# Patient Record
Sex: Female | Born: 1950 | Race: White | Hispanic: No | Marital: Married
Health system: Southern US, Community
[De-identification: ages and names within clinical notes are randomized; demographics above are authoritative.]

## PROBLEM LIST (undated history)

## (undated) DIAGNOSIS — R6520 Severe sepsis without septic shock: Secondary | ICD-10-CM

## (undated) DIAGNOSIS — I2699 Other pulmonary embolism without acute cor pulmonale: Secondary | ICD-10-CM

## (undated) DIAGNOSIS — I471 Supraventricular tachycardia, unspecified: Secondary | ICD-10-CM

## (undated) DIAGNOSIS — J9621 Acute and chronic respiratory failure with hypoxia: Secondary | ICD-10-CM

## (undated) DIAGNOSIS — A419 Sepsis, unspecified organism: Secondary | ICD-10-CM

## (undated) DIAGNOSIS — R198 Other specified symptoms and signs involving the digestive system and abdomen: Secondary | ICD-10-CM

---

## 2018-12-26 ENCOUNTER — Inpatient Hospital Stay
Admission: EM | Admit: 2018-12-26 | Discharge: 2019-01-17 | Disposition: E | Payer: Medicare Other | Source: Other Acute Inpatient Hospital | Attending: Internal Medicine | Admitting: Internal Medicine

## 2018-12-26 ENCOUNTER — Other Ambulatory Visit (HOSPITAL_COMMUNITY): Payer: Medicare Other

## 2018-12-26 DIAGNOSIS — I471 Supraventricular tachycardia, unspecified: Secondary | ICD-10-CM | POA: Diagnosis present

## 2018-12-26 DIAGNOSIS — K567 Ileus, unspecified: Secondary | ICD-10-CM

## 2018-12-26 DIAGNOSIS — R6520 Severe sepsis without septic shock: Secondary | ICD-10-CM | POA: Diagnosis present

## 2018-12-26 DIAGNOSIS — I2699 Other pulmonary embolism without acute cor pulmonale: Secondary | ICD-10-CM | POA: Diagnosis present

## 2018-12-26 DIAGNOSIS — R198 Other specified symptoms and signs involving the digestive system and abdomen: Secondary | ICD-10-CM | POA: Diagnosis present

## 2018-12-26 DIAGNOSIS — J9621 Acute and chronic respiratory failure with hypoxia: Secondary | ICD-10-CM | POA: Diagnosis present

## 2018-12-26 DIAGNOSIS — A419 Sepsis, unspecified organism: Secondary | ICD-10-CM | POA: Diagnosis present

## 2018-12-26 DIAGNOSIS — J9690 Respiratory failure, unspecified, unspecified whether with hypoxia or hypercapnia: Secondary | ICD-10-CM

## 2018-12-26 DIAGNOSIS — L0291 Cutaneous abscess, unspecified: Secondary | ICD-10-CM

## 2018-12-26 HISTORY — DX: Severe sepsis without septic shock: R65.20

## 2018-12-26 HISTORY — DX: Supraventricular tachycardia: I47.1

## 2018-12-26 HISTORY — DX: Sepsis, unspecified organism: A41.9

## 2018-12-26 HISTORY — DX: Other pulmonary embolism without acute cor pulmonale: I26.99

## 2018-12-26 HISTORY — DX: Acute and chronic respiratory failure with hypoxia: J96.21

## 2018-12-26 HISTORY — DX: Other specified symptoms and signs involving the digestive system and abdomen: R19.8

## 2018-12-26 HISTORY — DX: Supraventricular tachycardia, unspecified: I47.10

## 2018-12-27 ENCOUNTER — Other Ambulatory Visit (HOSPITAL_COMMUNITY): Payer: Medicare Other

## 2018-12-27 DIAGNOSIS — A419 Sepsis, unspecified organism: Secondary | ICD-10-CM

## 2018-12-27 DIAGNOSIS — I2782 Chronic pulmonary embolism: Secondary | ICD-10-CM | POA: Diagnosis not present

## 2018-12-27 DIAGNOSIS — I471 Supraventricular tachycardia: Secondary | ICD-10-CM

## 2018-12-27 DIAGNOSIS — J9621 Acute and chronic respiratory failure with hypoxia: Secondary | ICD-10-CM

## 2018-12-27 DIAGNOSIS — R198 Other specified symptoms and signs involving the digestive system and abdomen: Secondary | ICD-10-CM | POA: Diagnosis not present

## 2018-12-27 DIAGNOSIS — R6520 Severe sepsis without septic shock: Secondary | ICD-10-CM

## 2018-12-27 LAB — URINALYSIS, ROUTINE W REFLEX MICROSCOPIC
Bilirubin Urine: NEGATIVE
Glucose, UA: NEGATIVE mg/dL
Ketones, ur: NEGATIVE mg/dL
Leukocytes,Ua: NEGATIVE
Nitrite: NEGATIVE
Protein, ur: NEGATIVE mg/dL
Specific Gravity, Urine: 1.006 (ref 1.005–1.030)
pH: 7 (ref 5.0–8.0)

## 2018-12-27 LAB — CBC WITH DIFFERENTIAL/PLATELET
Abs Immature Granulocytes: 0.2 10*3/uL — ABNORMAL HIGH (ref 0.00–0.07)
Basophils Absolute: 0 10*3/uL (ref 0.0–0.1)
Basophils Relative: 0 %
Eosinophils Absolute: 0.1 10*3/uL (ref 0.0–0.5)
Eosinophils Relative: 1 %
HCT: 28.2 % — ABNORMAL LOW (ref 36.0–46.0)
Hemoglobin: 8.8 g/dL — ABNORMAL LOW (ref 12.0–15.0)
Lymphocytes Relative: 11 %
Lymphs Abs: 1.1 10*3/uL (ref 0.7–4.0)
MCH: 29.7 pg (ref 26.0–34.0)
MCHC: 31.2 g/dL (ref 30.0–36.0)
MCV: 95.3 fL (ref 80.0–100.0)
Metamyelocytes Relative: 2 %
Monocytes Absolute: 0.3 10*3/uL (ref 0.1–1.0)
Monocytes Relative: 3 %
Neutro Abs: 8 10*3/uL — ABNORMAL HIGH (ref 1.7–7.7)
Neutrophils Relative %: 83 %
Platelets: 322 10*3/uL (ref 150–400)
RBC: 2.96 MIL/uL — ABNORMAL LOW (ref 3.87–5.11)
RDW: 18.7 % — ABNORMAL HIGH (ref 11.5–15.5)
WBC: 9.6 10*3/uL (ref 4.0–10.5)
nRBC: 0 /100 WBC
nRBC: 0.6 % — ABNORMAL HIGH (ref 0.0–0.2)

## 2018-12-27 LAB — MAGNESIUM: Magnesium: 2.4 mg/dL (ref 1.7–2.4)

## 2018-12-27 LAB — PROTIME-INR
INR: 1.1 (ref 0.8–1.2)
Prothrombin Time: 14.1 seconds (ref 11.4–15.2)

## 2018-12-27 LAB — COMPREHENSIVE METABOLIC PANEL
ALT: 25 U/L (ref 0–44)
AST: 29 U/L (ref 15–41)
Albumin: 2.2 g/dL — ABNORMAL LOW (ref 3.5–5.0)
Alkaline Phosphatase: 211 U/L — ABNORMAL HIGH (ref 38–126)
Anion gap: 14 (ref 5–15)
BUN: 76 mg/dL — ABNORMAL HIGH (ref 8–23)
CO2: 24 mmol/L (ref 22–32)
Calcium: 9.5 mg/dL (ref 8.9–10.3)
Chloride: 107 mmol/L (ref 98–111)
Creatinine, Ser: 1.18 mg/dL — ABNORMAL HIGH (ref 0.44–1.00)
GFR calc Af Amer: 55 mL/min — ABNORMAL LOW (ref >60)
GFR calc non Af Amer: 48 mL/min — ABNORMAL LOW (ref >60)
Glucose, Bld: 116 mg/dL — ABNORMAL HIGH (ref 70–99)
Potassium: 3.3 mmol/L — ABNORMAL LOW (ref 3.5–5.1)
Sodium: 145 mmol/L (ref 135–145)
Total Bilirubin: 1.2 mg/dL (ref 0.3–1.2)
Total Protein: 7.6 g/dL (ref 6.5–8.1)

## 2018-12-27 LAB — PHOSPHORUS: Phosphorus: 4.4 mg/dL (ref 2.5–4.6)

## 2018-12-27 LAB — HEMOGLOBIN A1C
Hgb A1c MFr Bld: 5.7 % — ABNORMAL HIGH (ref 4.8–5.6)
Mean Plasma Glucose: 116.89 mg/dL

## 2018-12-27 LAB — TSH: TSH: 6.46 u[IU]/mL — ABNORMAL HIGH (ref 0.350–4.500)

## 2018-12-27 MED ORDER — IOHEXOL 300 MG/ML  SOLN
100.0000 mL | Freq: Once | INTRAMUSCULAR | Status: AC | PRN
  Administered 2018-12-27: 100 mL via INTRAVENOUS

## 2018-12-27 MED ORDER — Medication
4000.00 | Status: DC
Start: ? — End: 2018-12-27

## 2018-12-27 MED ORDER — Medication
Status: DC
Start: ? — End: 2018-12-27

## 2018-12-27 MED ORDER — GENERIC EXTERNAL MEDICATION
Status: DC
Start: ? — End: 2018-12-27

## 2018-12-27 MED ORDER — BENICAR 20 MG PO TABS
25.00 | ORAL_TABLET | ORAL | Status: DC
Start: ? — End: 2018-12-27

## 2018-12-27 MED ORDER — ATROPINE SULFATE 1 MG/10ML IJ SOSY [COMPILED RECORD] [AGE PEDIATRIC]
1.00 | PREFILLED_SYRINGE | INTRAMUSCULAR | Status: DC
Start: ? — End: 2018-12-27

## 2018-12-27 MED ORDER — PHENYLEPHRINE-GUAIFENESIN 20-375 MG PO CP12
10.00 | ORAL_CAPSULE | ORAL | Status: DC
Start: ? — End: 2018-12-27

## 2018-12-27 MED ORDER — Medication
1.00 | Status: DC
Start: ? — End: 2018-12-27

## 2018-12-27 MED ORDER — BL TUSSIN CF 30-10-100 MG/5ML PO SYRP
2.00 | ORAL_SOLUTION | ORAL | Status: DC
Start: ? — End: 2018-12-27

## 2018-12-27 MED ORDER — COPPER (TRACE MINERALS)
0.00 | Status: DC
Start: 2018-12-26 — End: 2018-12-27

## 2018-12-27 MED ORDER — SURE RESULT SR RELIEF EX
1.00 | CUTANEOUS | Status: DC
Start: ? — End: 2018-12-27

## 2018-12-27 MED ORDER — EQUATE NICOTINE 4 MG MT GUM
4.00 | CHEWING_GUM | OROMUCOSAL | Status: DC
Start: ? — End: 2018-12-27

## 2018-12-27 MED ORDER — Medication
4.00 | Status: DC
Start: ? — End: 2018-12-27

## 2018-12-27 MED ORDER — MYLANTA ULTRA 700-300 MG PO CHEW
3.00 | CHEWABLE_TABLET | ORAL | Status: DC
Start: 2018-12-26 — End: 2018-12-27

## 2018-12-27 MED ORDER — CELLULOSE SODIUM PHOSPHATE VI
5000.00 | Status: DC
Start: 2018-12-26 — End: 2018-12-27

## 2018-12-27 MED ORDER — PILOCARPINE-EPINEPHRINE 1-1 % OP SOLN
OPHTHALMIC | Status: DC
Start: 2018-12-27 — End: 2018-12-27

## 2018-12-27 MED ORDER — SODIUM CHLORIDE 0.9 % IV SOLN
INTRAVENOUS | Status: DC
Start: ? — End: 2018-12-27

## 2018-12-27 MED ORDER — ASTELIN 137 MCG/SPRAY NA SOLN
40.00 | NASAL | Status: DC
Start: 2018-12-26 — End: 2018-12-27

## 2018-12-27 MED ORDER — LOVENOX 150 MG/ML ~~LOC~~ SOLN
2.00 | SUBCUTANEOUS | Status: DC
Start: ? — End: 2018-12-27

## 2018-12-27 MED ORDER — SUPER B-50 B COMPLEX PO
0.04 | ORAL | Status: DC
Start: ? — End: 2018-12-27

## 2018-12-27 MED ORDER — MILRINONE LACTATE 20 MG/20ML IV SOLN
0.50 | INTRAVENOUS | Status: DC
Start: ? — End: 2018-12-27

## 2018-12-27 MED ORDER — GLUCOSE BLOOD VI
20.00 | Status: DC
Start: ? — End: 2018-12-27

## 2018-12-27 MED ORDER — PHENOL EX
40.00 | CUTANEOUS | Status: DC
Start: 2018-12-27 — End: 2018-12-27

## 2018-12-27 MED ORDER — JENTADUETO 2.5-1000 MG PO TABS
12.00 | ORAL_TABLET | ORAL | Status: DC
Start: ? — End: 2018-12-27

## 2018-12-27 MED ORDER — ETRAFON 2-10 2-10 MG OR TABS
650.00 | ORAL_TABLET | ORAL | Status: DC
Start: ? — End: 2018-12-27

## 2018-12-27 MED ORDER — STRI-DEX MAXIMUM STRENGTH 2 % EX PADS
MEDICATED_PAD | CUTANEOUS | Status: DC
Start: ? — End: 2018-12-27

## 2018-12-27 MED ORDER — TL-HIST PD PO
0.02 | ORAL | Status: DC
Start: ? — End: 2018-12-27

## 2018-12-27 MED ORDER — Medication
40.00 | Status: DC
Start: ? — End: 2018-12-27

## 2018-12-27 NOTE — Consult Note (Signed)
Infectious Disease Consultation   Holly Wallace  WUJ:[id number]  DOB: 1950-08-03  DOA: 01/10/2019  Requesting physician: Dr.Hijazi  Reason for consultation: Antibiotic recommendations   History of Present Illness: Patient with trach in place, confused.  Unable to provide any history at this time.  Therefore history obtained from medical records. Holly Wallace is an 68 y.o. female with history of atrial fibrillation, coronary disease, COPD, GERD, hypertension, seizure disorder, recent pulmonary embolism on anticoagulation who [facility] with abdominal pain that started on November 02, 2018.  She also had epigastric pain and nausea.  Symptoms got progressively worse.  Patient reportedly mentioned in the ER that she was having on and off pain for couple of years but did not seek medical attention.  Lab work showed elevated WBC of 15.2.  Abdominal x-ray showed large hiatal hernia, nonobstructive bowel gas pattern.  CT of the abdomen and pelvis showed large hiatal hernia and extraluminal air with infiltrative changes within the hernia indicating perforated viscus.  Surgery was consulted.  Patient was taken emergently to the OR for the first of many abdominal procedures.  She has a history of gastric bypass.   Following is the list of her Surgeries per records from outside hosp: admitted on 11/03/18 with Pneumoperitoneum [K66.8]. Ms. Holly Wallace was admitted with complaints of abdominal pain and work-up was revealing for pneumoperitoneum secondary to perforated viscus. She was taken to the OR for emergent laparotomy with washout and repair. She went back to the OR on 11/19/2018 for reexploration, right colectomy, extensive lysis of adhesions, ileostomy creation, and placement of a negative pressure dressing. Was taken to the OR on 11/22/2018 for reexploration, washout, gastrostomy tube placement, complex abdominal wall closure and negative pressure dressing.  Tracheostomy was placed at the bedside on 11/26/2018. Taken to the OR on 11/27/2018, 11/28/2018, 11/29/2018 for abdominal washout and tightening of the patch. Last surgery was on 12/02/2018 for fascial closure and EGD.   Subsequent procedure on 12/02/2018 where she had wound closure?  Placement of negative pressure dressing.  She had bedside trach on 12/06/2018.  Per records from the outside facility which are quite patchy apparently trickle feed trial was initiated on 12/05/2018 resulted in entry drainage from JP. 12/20/2018 status post endoscopy by GI service with placement of bear claw clip at the area of the anastomotic dehiscence within the gastric remnant. Patient's stay was complicated by septic shock status post pressors and fluid boluses.  Hospital course complicated by respiratory failure. Not sure what antibiotics she received at the outside facility.  Looks like she got treated with Levaquin.  Wound cultures at the outside facility showed coag negative staph, E. coli which was resistant to Levaquin.  She is currently on TPN.  She is very confused at this time.   Review of Systems:  Unable to obtain review of systems.  Past Medical History: . Anxiety  . Arthritis  . Atrial fibrillation (HCC)  . CAD (coronary artery disease)  pt states h/o mild MI  . Calculus of kidney  10 years ago  . COPD (chronic obstructive pulmonary disease) (HCC)  . ETOH abuse  Pt States she hasn't drank any alcohol since December 2018  . GERD (gastroesophageal reflux disease)  . Headache  pt states chronic headaches  . Hypertension  . Obstructive sleep apnea  doesn't have cpap  . Osteoporosis  . Seizure disorder (HCC)  pt states seizure x 1  . Thromboembolism (HCC)  12/29/2016  Femoral Filter placed   Past Surgical History: . HX ESOPHAGOSCOPY W/ DILATION  Approximately 2 years ago  . HX GASTRIC BYPASS  . HX GASTRIC BYPASS  Bypass was 25-30 years ago  . HX HEART CATHETERIZATION  . HX TEMPORAL  ARTERY BIOPSY / LIGATION 10/29/2018  . HX TUBAL LIGATION  . IR VENA CAVA FILTER PLACEMENT 12/29/2016  IR VENA CAVA FILTER PLACEMENT 12/29/2016 Holly Gearing, MD [facility]  . IR VENA CAVA FILTER RETRIEVAL 06/12/2017  IR VENA CAVA FILTER RETRIEVAL 06/12/2017 Holly Gearing, MD [facility]  . PR COLONOSCOPY FLX DX W/COLLJ SPEC WHEN PFRMD N/A 04/02/2017  COLONOSCOPY FLX DX W/COLLJ SPEC WHEN PFRMD performed by Holly Garfinkel, MD at [facility] ENDO  . PR ESOPHAGOGASTRODUODENOSCOPY TRANSORAL DIAGNOSTIC N/A 01/28/2014  ESOPHAGOGASTRODUODENOSCOPY performed by Holly Garfinkel, MD at [facility] ENDO  . PR ESOPHAGOGASTRODUODENOSCOPY TRANSORAL DIAGNOSTIC N/A 04-01-202018  ESOPHAGOGASTRODUODENOSCOPY TRANSORAL DIAGNOSTIC performed by Holly Garfinkel, MD at [facility] ENDO  . PT DENIES RELEVANT SURGICAL HISTORY    Allergies:  Latex Hives  . Adhesive Tape-Silicones Rash and Swelling  . Diphenhydramine Hcl Rash  . Lipitor [Atorvastatin] Unknown  . Paroxetine Hcl Hives  . Strawberry Hives  . Sumatriptan Headache  . Imitrex [Sumatriptan Succinate] Nausea and Vomiting  . Rocephin [Ceftriaxone] Rash    Social History: Obtained from medical records reports that she quit smoking about 10 years ago. Her smoking use included cigarettes. She started smoking about 53 years ago. She smoked 0.50 packs per day. She has never used smokeless tobacco. She reports current alcohol use of about 5.0 standard drinks of alcohol per week. She reports that she does not use drugs.   Family History: FamHx: family history includes Cancer-Breast in her sister; Cancer-Colon in her brother; Heart Disease in her father and mother; Hypertension in her father and mother; Other in her father.     Physical Exam: Vitals: Temperature 100.7, pulse 112, respiratory 37, blood pressure 133/64, pulse oximetry 95% Constitutional:  Ill-appearing, obese female, confused Head: Normocephalic, atraumatic Eyes: PERLA, no scleral icterus ENMT: external ears and nose appear normal, normal hearing, Lips appears normal  Neck: Has trach in place CVS: S1-S2, no murmur Respiratory: Occasional rhonchi, no wheezing Abdomen: Obese female, she has dressing in place, abdominal drain with purulent drainage from around the drain, ostomy, tenderness without any guarding or rebound Musculoskeletal: Mild lower extremity edema Neuro: She is confused, not following commands.  Unable to do a neurologic exam at this time. Psych: Unable to assess  skin: no rashes  Data reviewed:  I have personally reviewed following labs and imaging studies Labs:  CBC: Recent Labs  Lab 12/27/18 0518  WBC 9.6  NEUTROABS 8.0*  HGB 8.8*  HCT 28.2*  MCV 95.3  PLT 322    Basic Metabolic Panel: Recent Labs  Lab 12/27/18 0518  NA 145  K 3.3*  CL 107  CO2 24  GLUCOSE 116*  BUN 76*  CREATININE 1.18*  CALCIUM 9.5  MG 2.4  PHOS 4.4   GFR CrCl cannot be calculated (Unknown ideal weight.). Liver Function Tests: Recent Labs  Lab 12/27/18 0518  AST 29  ALT 25  ALKPHOS 211*  BILITOT 1.2  PROT 7.6  ALBUMIN 2.2*   No results for input(s): LIPASE, AMYLASE in the last 168 hours. No results for input(s): AMMONIA in the last 168 hours. Coagulation profile Recent Labs  Lab 12/27/18 0518  INR 1.1    Cardiac Enzymes: No  results for input(s): CKTOTAL, CKMB, CKMBINDEX, TROPONINI in the last 168 hours. BNP: Invalid input(s): POCBNP CBG: No results for input(s): GLUCAP in the last 168 hours. D-Dimer No results for input(s): DDIMER in the last 72 hours. Hgb A1c Recent Labs    12/27/18 0518  HGBA1C 5.7*   Lipid Profile No results for input(s): CHOL, HDL, LDLCALC, TRIG, CHOLHDL, LDLDIRECT in the last 72 hours. Thyroid function studies Recent Labs    12/27/18 0518  TSH 6.460*   Anemia work up No results for input(s): VITAMINB12,  FOLATE, FERRITIN, TIBC, IRON, RETICCTPCT in the last 72 hours. Urinalysis    Component Value Date/Time   COLORURINE STRAW (A) 12/27/2018 1223   APPEARANCEUR CLEAR 12/27/2018 1223   LABSPEC 1.006 12/27/2018 1223   PHURINE 7.0 12/27/2018 1223   GLUCOSEU NEGATIVE 12/27/2018 1223   HGBUR SMALL (A) 12/27/2018 1223   BILIRUBINUR NEGATIVE 12/27/2018 1223   KETONESUR NEGATIVE 12/27/2018 1223   PROTEINUR NEGATIVE 12/27/2018 1223   NITRITE NEGATIVE 12/27/2018 1223   LEUKOCYTESUR NEGATIVE 12/27/2018 1223     Microbiology Recent Results (from the past 240 hour(s))  Culture, respiratory (non-expectorated)     Status: None (Preliminary result)   Collection Time: 12/27/18 10:20 AM   Specimen: Tracheal Aspirate; Respiratory  Result Value Ref Range Status   Specimen Description TRACHEAL ASPIRATE PER RN KRISTY  Final   Special Requests   Final    NONE Performed at [facility], [place] [address], [place], [place] [address]    Gram Stain PENDING  Incomplete   Culture PENDING  Incomplete   Report Status PENDING  Incomplete       Inpatient Medications:   Scheduled Meds: Please see MAR   Radiological Exams on Admission: DG CHEST PORT 1 VIEW  Result Date: 01/15/2019 CLINICAL DATA:  Respiratory distress EXAM: PORTABLE CHEST 1 VIEW COMPARISON:  None. FINDINGS: Tracheostomy tube in the mid trachea. Lung volumes are diminished with diffuse hazy opacities likely reflecting some atelectatic change. More focal opacity is noted in the left lung base adjacent a moderate left pleural effusion. No pneumothorax. Cardiomediastinal contours partially obscured by overlying opacity. Visible portions are unremarkable. No acute osseous or soft tissue abnormality. Surgical clips in the left upper quadrant. IMPRESSION: 1. Focal opacity in the left lung base adjacent to a moderate left pleural effusion, favor passive atelectasis though underlying infection cannot be excluded. 2. Low lung volumes with more  diffuse atelectatic changes. 3. Tracheostomy tube in the mid trachea. Electronically Signed   By: Lovena Le M.D.   On: 12/28/2018 20:45   DG Abd Portable 1V  Result Date: 01/11/2019 CLINICAL DATA:  Concern for ileus EXAM: PORTABLE ABDOMEN - 1 VIEW COMPARISON:  None. FINDINGS: A percutaneous enteric tube projects over the left upper quadrant with extensive surgical material seen in the left upper quadrant as well. There is a paucity of abdominal bowel gas which is a nonspecific finding. No convincing features of free intraperitoneal air though evaluation limited on supine only radiography. Dense calcification projects over the right sacrum. Could reflect material within the bowel, fecalith from the colon, amongst other etiologies. Few phleboliths are noted in the pelvis. Degenerative changes present in the spine and bony pelvis. IMPRESSION: 1. Paucity of abdominal bowel gas which is a nonspecific finding that can be both normal or seen with ileus or obstruction. 2. Right upper quadrant percutaneous enteric tube with postsurgical changes in the left upper quadrant. Placement poorly assessed in the absence of contrast. 3. Dense calcification projects  over the right sacrum, nonspecific. Possibly ingested material, fecalith, fibroid among other possibilities. Electronically Signed   By: Kreg Shropshire M.D.   On: 12/25/2018 20:48    Impression/Recommendations Acute respiratory failure Status post septic shock Peritonitis status post bowel perforation Intra-abdominal abscess? Status post multiple abdominal surgeries with ostomy Postoperative abdominal wound Acute on chronic renal failure DVT with recent pulmonary embolism Atrial fibrillation Dysphagia Protein calorie malnutrition Morbid obesity Acute toxic/metabolic encephalopathy  Acute respiratory failure: Patient has morbid obesity likely has obesity hypoventilation syndrome.  She probably also has a history of COPD given the history of smoking.   The acute respiratory failure happened after she underwent surgery for bowel perforation/peritonitis.  She subsequently required multiple surgeries.  Currently on trach.  Chest x-ray per report does not mention pneumonia.  However, she is at high risk for ventilator associated pneumonia, she does have dysphagia, high risk for aspiration and aspiration pneumonia. Pulmonary consulted.  Cultures ordered.  Status post septic shock: Likely secondary to the peritonitis.  Patient reportedly required pressors at the outside facility.  She apparently was treated with Levaquin.  Could not find any cultures.  She is at risk for recurrent sepsis.  She has concern for intra-abdominal abscess with drainage of purulent material from around her abdominal drain.  Intraoperative cultures from the outside facility showed E. coli which was apparently resistant for Levaquin.  Currently cultures have been ordered.  We will start her on empiric meropenem.  Follow-up on the cultures.  Continue to monitor.  Peritonitis status post bowel perforation: She is status post multiple abdominal surgeries currently with ostomy and also has drain in place along with dressing.  She has purulent material coming from around the drain site.  Concern for intra-abdominal abscess.  CT of the abdomen and pelvis has been ordered.  Cultures also ordered.  We will start her on empiric IV meropenem.  She has allergy listed to ceftriaxone.  Intra-abdominal abscess: As mentioned above she has an abdominal drain and currently has purulent material coming from around the drain.  She is status post multiple abdominal surgeries as mentioned above in the HPI.  Cultures ordered.  We will start her on empiric IV meropenem.  CT of the abdomen and pelvis ordered by the primary team to evaluate. Further plan depending on the CT result.  Acute on chronic renal failure: Patient noted to have elevated BUN/creatinine.  Antibiotic renally dosed.  Continue to monitor  BUN/creatinine.  Further management of the renal failure per the primary team. DVT with recent pulmonary embolism: Anticoagulation management per the primary team.  Atrial fibrillation: Medications and management per primary team.  Dysphagia: Due to her dysphagia she is high risk for ongoing aspiration and worsening respiratory failure from aspiration pneumonia.  Protein calorie malnutrition: Patient currently on TPN.  Due to her being on TPN she is very high risk for fungemia.  If she starts having any worsening fevers or leukocytosis would recommend to send for blood cultures.  Acute toxic/metabolic encephalopathy: Multifactorial.  Likely secondary to the intra-abdominal infection, acute respiratory failure, acute on chronic renal failure.  Antibiotics as mentioned above.  Continue supportive management per primary team.  Unfortunately due to her complex medical problems she is a very high risk was anticoagulation. Thank you for involving [place] in the care of this patient.  Vonzella Nipple M.D. 12/27/2018, 2:27 PM

## 2018-12-27 NOTE — Progress Notes (Signed)
Pulmonary Critical Care Medicine [facility]  PULMONARY SERVICE  Date of Service: 12/27/2018  PULMONARY CRITICAL CARE LEKEYA ROLLINGS Dickard  NOB:[id number]  DOB: [birth year]   DOA: 01/04/2019  Referring Physician: Carron Curie, MD  HPI: Holly Wallace is a 68 y.o. female seen for follow up of Acute on Chronic Respiratory Failure.  Patient has multiple medical problems including atrial fibrillation coronary artery disease COPD GERD hypertension seizure disorder and also history of pulmonary embolism.  She presented to the hospital because of abdominal pain issues.  Pain that started in the epigastric area also was having some nausea.  It did get worse and because of the worsening she decided to come into the emergency department.  The patient apparently had a history of a remote gastric bypass repair and had a prolonged hospitalization at the other facility.  Was intubated was not able to come off the airway and eventually ended up having to have a tracheostomy done.  Because it was a GI surgery was also deemed that she would require TPN.  Transferred to our facility now for further management and weaning currently is on T collar.  Review of Systems:  ROS performed and is unremarkable other than noted above.  PMHx:  Past Medical History:  Diagnosis Date  . Anxiety  . Arthritis  . Atrial fibrillation (HCC)  . CAD (coronary artery disease)  pt states h/o mild MI  . Calculus of kidney  10 years ago  . COPD (chronic obstructive pulmonary disease) (HCC)  . ETOH abuse  Pt States she hasn't drank any alcohol since December 2018  . GERD (gastroesophageal reflux disease)  . Headache  pt states chronic headaches  . Hypertension  . Obstructive sleep apnea  doesn't have cpap  . Osteoporosis  . Seizure disorder (HCC)  pt states seizure x 1  . Thromboembolism (HCC) 12/29/2016  Femoral Filter placed   PSurgHx:  Past Surgical History:  Procedure Laterality Date  . HX  ESOPHAGOSCOPY W/ DILATION  Approximately 2 years ago  . HX GASTRIC BYPASS  . HX GASTRIC BYPASS  Bypass was 25-30 years ago  . HX HEART CATHETERIZATION  . HX TEMPORAL ARTERY BIOPSY / LIGATION 10/29/2018  . HX TUBAL LIGATION  . IR VENA CAVA FILTER PLACEMENT 12/29/2016  IR VENA CAVA FILTER PLACEMENT 12/29/2016 Marchelle Gearing, MD [facility]  . IR VENA CAVA FILTER RETRIEVAL 06/12/2017  IR VENA CAVA FILTER RETRIEVAL 06/12/2017 Marchelle Gearing, MD [facility]  . PR COLONOSCOPY FLX DX W/COLLJ SPEC WHEN PFRMD N/A 04/02/2017  COLONOSCOPY FLX DX W/COLLJ SPEC WHEN PFRMD performed by Lucillie Garfinkel, MD at [facility] ENDO  . PR ESOPHAGOGASTRODUODENOSCOPY TRANSORAL DIAGNOSTIC N/A 01/28/2014  ESOPHAGOGASTRODUODENOSCOPY performed by Lucillie Garfinkel, MD at [facility] ENDO  . PR ESOPHAGOGASTRODUODENOSCOPY TRANSORAL DIAGNOSTIC N/A 13-May-202018  ESOPHAGOGASTRODUODENOSCOPY TRANSORAL DIAGNOSTIC performed by Lucillie Garfinkel, MD at [facility] ENDO  . PT DENIES RELEVANT SURGICAL HISTORY   FamHx: family history includes Cancer-Breast in her sister; Cancer-Colon in her brother; Heart Disease in her father and mother; Hypertension in her father and mother; Other in her father.  SocHx: reports that she quit smoking about 10 years ago. Her smoking use included cigarettes. She started smoking about 53 years ago. She smoked 0.50 packs per day. She has never used smokeless tobacco. She reports current alcohol use of about 5.0 standard drinks of alcohol per week. She reports that she does not use drugs.   Medications: Reviewed on  Rounds  Physical Exam:  Vitals: Temperature 97.2 pulse 103 respiratory rate 33 blood pressures 131/69 saturations 98%  Ventilator Settings off the ventilator on T collar currently on 28% FiO2  . General: Comfortable at this time . Eyes: Grossly normal lids, irises & conjunctiva . ENT: grossly  tongue is normal . Neck: no obvious mass . Cardiovascular: S1-S2 normal no gallop no rub . Respiratory: No rhonchi no rales are noted at this time . Abdomen: Soft and nontender . Skin: no rash seen on limited exam . Musculoskeletal: not rigid . Psychiatric:unable to assess . Neurologic: no seizure no involuntary movements         Labs on Admission:  Basic Metabolic Panel: Recent Labs  Lab 12/27/18 0518  NA 145  K 3.3*  CL 107  CO2 24  GLUCOSE 116*  BUN 76*  CREATININE 1.18*  CALCIUM 9.5  MG 2.4  PHOS 4.4    No results for input(s): PHART, PCO2ART, PO2ART, HCO3, O2SAT in the last 168 hours.  Liver Function Tests: Recent Labs  Lab 12/27/18 0518  AST 29  ALT 25  ALKPHOS 211*  BILITOT 1.2  PROT 7.6  ALBUMIN 2.2*   No results for input(s): LIPASE, AMYLASE in the last 168 hours. No results for input(s): AMMONIA in the last 168 hours.  CBC: Recent Labs  Lab 12/27/18 0518  WBC 9.6  NEUTROABS 8.0*  HGB 8.8*  HCT 28.2*  MCV 95.3  PLT 322    Cardiac Enzymes: No results for input(s): CKTOTAL, CKMB, CKMBINDEX, TROPONINI in the last 168 hours.  BNP (last 3 results) No results for input(s): BNP in the last 8760 hours.  ProBNP (last 3 results) No results for input(s): PROBNP in the last 8760 hours.   Radiological Exams on Admission: DG CHEST PORT 1 VIEW  Result Date: 12-01-2018 CLINICAL DATA:  Respiratory distress EXAM: PORTABLE CHEST 1 VIEW COMPARISON:  None. FINDINGS: Tracheostomy tube in the mid trachea. Lung volumes are diminished with diffuse hazy opacities likely reflecting some atelectatic change. More focal opacity is noted in the left lung base adjacent a moderate left pleural effusion. No pneumothorax. Cardiomediastinal contours partially obscured by overlying opacity. Visible portions are unremarkable. No acute osseous or soft tissue abnormality. Surgical clips in the left upper quadrant. IMPRESSION: 1. Focal opacity in the left lung base adjacent to a  moderate left pleural effusion, favor passive atelectasis though underlying infection cannot be excluded. 2. Low lung volumes with more diffuse atelectatic changes. 3. Tracheostomy tube in the mid trachea. Electronically Signed   By: Kreg ShropshirePrice  DeHay M.D.   On: 12-01-2018 20:45   DG Abd Portable 1V  Result Date: 12-01-2018 CLINICAL DATA:  Concern for ileus EXAM: PORTABLE ABDOMEN - 1 VIEW COMPARISON:  None. FINDINGS: A percutaneous enteric tube projects over the left upper quadrant with extensive surgical material seen in the left upper quadrant as well. There is a paucity of abdominal bowel gas which is a nonspecific finding. No convincing features of free intraperitoneal air though evaluation limited on supine only radiography. Dense calcification projects over the right sacrum. Could reflect material within the bowel, fecalith from the colon, amongst other etiologies. Few phleboliths are noted in the pelvis. Degenerative changes present in the spine and bony pelvis. IMPRESSION: 1. Paucity of abdominal bowel gas which is a nonspecific finding that can be both normal or seen with ileus or obstruction. 2. Right upper quadrant percutaneous enteric tube with postsurgical changes in the left upper quadrant. Placement poorly assessed in the absence  of contrast. 3. Dense calcification projects over the right sacrum, nonspecific. Possibly ingested material, fecalith, fibroid among other possibilities. Electronically Signed   By: Lovena Le M.D.   On: 12/24/2018 20:48    Assessment/Plan Active Problems:   Acute on chronic respiratory failure with hypoxia (HCC)   Severe sepsis (HCC)   Supraventricular tachycardia (HCC)   Pulmonary embolism (HCC)   Perforated abdominal viscus   1. Acute on chronic respiratory failure with hypoxia patient right now is T collar has been on 28% FiO2 with good saturations.  Patient has a #8 trach in place which will be downsized to a #6 cuffless trach at this point 2. Sepsis with  shock resolved patient right now is hemodynamic stable we will continue to monitor the patient blood pressures closely. 3. Supraventricular tachycardia currently rate controlled we will continue with supportive care. 4. Pulmonary embolism patient has an intravenous filter in place. 5. Perforated viscus status post exploratory laparotomy and repair.  I have personally seen and evaluated the patient, evaluated laboratory and imaging results, formulated the assessment and plan and placed orders. The Patient requires high complexity decision making for assessment and support.  Case was discussed on Rounds with the Respiratory Therapy Staff Time Spent 3minutes  Allyne Gee, MD [facility] Pulmonary Critical Care Medicine Sleep Medicine

## 2018-12-28 ENCOUNTER — Encounter: Payer: Self-pay | Admitting: Internal Medicine

## 2018-12-28 DIAGNOSIS — J9621 Acute and chronic respiratory failure with hypoxia: Secondary | ICD-10-CM | POA: Diagnosis not present

## 2018-12-28 DIAGNOSIS — I2782 Chronic pulmonary embolism: Secondary | ICD-10-CM | POA: Diagnosis not present

## 2018-12-28 DIAGNOSIS — I471 Supraventricular tachycardia, unspecified: Secondary | ICD-10-CM | POA: Diagnosis present

## 2018-12-28 DIAGNOSIS — A419 Sepsis, unspecified organism: Secondary | ICD-10-CM | POA: Diagnosis not present

## 2018-12-28 DIAGNOSIS — R6520 Severe sepsis without septic shock: Secondary | ICD-10-CM | POA: Diagnosis present

## 2018-12-28 DIAGNOSIS — R198 Other specified symptoms and signs involving the digestive system and abdomen: Secondary | ICD-10-CM | POA: Diagnosis not present

## 2018-12-28 DIAGNOSIS — I2699 Other pulmonary embolism without acute cor pulmonale: Secondary | ICD-10-CM | POA: Diagnosis present

## 2018-12-28 LAB — RENAL FUNCTION PANEL
Albumin: 2 g/dL — ABNORMAL LOW (ref 3.5–5.0)
Anion gap: 11 (ref 5–15)
BUN: 57 mg/dL — ABNORMAL HIGH (ref 8–23)
CO2: 23 mmol/L (ref 22–32)
Calcium: 9.1 mg/dL (ref 8.9–10.3)
Chloride: 111 mmol/L (ref 98–111)
Creatinine, Ser: 1.09 mg/dL — ABNORMAL HIGH (ref 0.44–1.00)
GFR calc Af Amer: >60 mL/min (ref >60)
GFR calc non Af Amer: 52 mL/min — ABNORMAL LOW (ref >60)
Glucose, Bld: 151 mg/dL — ABNORMAL HIGH (ref 70–99)
Phosphorus: 5.1 mg/dL — ABNORMAL HIGH (ref 2.5–4.6)
Potassium: 3.6 mmol/L (ref 3.5–5.1)
Sodium: 145 mmol/L (ref 135–145)

## 2018-12-28 LAB — CBC
HCT: 29.2 % — ABNORMAL LOW (ref 36.0–46.0)
Hemoglobin: 9.1 g/dL — ABNORMAL LOW (ref 12.0–15.0)
MCH: 29.6 pg (ref 26.0–34.0)
MCHC: 31.2 g/dL (ref 30.0–36.0)
MCV: 95.1 fL (ref 80.0–100.0)
Platelets: 354 10*3/uL (ref 150–400)
RBC: 3.07 MIL/uL — ABNORMAL LOW (ref 3.87–5.11)
RDW: 19.1 % — ABNORMAL HIGH (ref 11.5–15.5)
WBC: 8.5 10*3/uL (ref 4.0–10.5)
nRBC: 0.6 % — ABNORMAL HIGH (ref 0.0–0.2)

## 2018-12-28 LAB — TRIGLYCERIDES: Triglycerides: 120 mg/dL (ref <150)

## 2018-12-28 LAB — PHOSPHORUS: Phosphorus: 4.9 mg/dL — ABNORMAL HIGH (ref 2.5–4.6)

## 2018-12-28 LAB — MAGNESIUM: Magnesium: 2.4 mg/dL (ref 1.7–2.4)

## 2018-12-28 NOTE — Progress Notes (Addendum)
Pulmonary Critical Care Medicine [facility]   PULMONARY CRITICAL CARE SERVICE  PROGRESS NOTE  Date of Service: 12/28/2018  Holly Wallace  ZOX:[id number]  DOB: [birth year]   DOA: 01/09/2019  Referring Physician: Carron CurieAli Hijazi, MD  HPI: Holly Wallace is a 68 y.o. female seen for follow up of Acute on Chronic Respiratory Failure.  Resting comfortably currently is on T collar has been on 28% FiO2 with good saturations.  Secretions are fair to moderate  Medications: Reviewed on Rounds  Physical Exam:  Vitals: Temperature 96.4 pulse 86 respiratory rate 30 blood pressure 124/62 saturations 98%  Ventilator Settings off the ventilator currently on T collar with an FiO2 of 28%  . General: Comfortable at this time . Eyes: Grossly normal lids, irises & conjunctiva . ENT: grossly tongue is normal . Neck: no obvious mass . Cardiovascular: S1 S2 normal no gallop . Respiratory: No rhonchi no rales are noted at this time . Abdomen: soft . Skin: no rash seen on limited exam . Musculoskeletal: not rigid . Psychiatric:unable to assess . Neurologic: no seizure no involuntary movements         Lab Data:   Basic Metabolic Panel: Recent Labs  Lab 12/27/18 0518 12/28/18 0436  NA 145 145  K 3.3* 3.6  CL 107 111  CO2 24 23  GLUCOSE 116* 151*  BUN 76* 57*  CREATININE 1.18* 1.09*  CALCIUM 9.5 9.1  MG 2.4 2.4  PHOS 4.4 4.9*  5.1*    ABG: No results for input(s): PHART, PCO2ART, PO2ART, HCO3, O2SAT in the last 168 hours.  Liver Function Tests: Recent Labs  Lab 12/27/18 0518 12/28/18 0436  AST 29  --   ALT 25  --   ALKPHOS 211*  --   BILITOT 1.2  --   PROT 7.6  --   ALBUMIN 2.2* 2.0*   No results for input(s): LIPASE, AMYLASE in the last 168 hours. No results for input(s): AMMONIA in the last 168 hours.  CBC: Recent Labs  Lab 12/27/18 0518 12/28/18 0436  WBC 9.6 8.5  NEUTROABS 8.0*  --   HGB 8.8* 9.1*  HCT 28.2* 29.2*  MCV 95.3 95.1  PLT 322  354    Cardiac Enzymes: No results for input(s): CKTOTAL, CKMB, CKMBINDEX, TROPONINI in the last 168 hours.  BNP (last 3 results) No results for input(s): BNP in the last 8760 hours.  ProBNP (last 3 results) No results for input(s): PROBNP in the last 8760 hours.  Radiological Exams: CT ABDOMEN PELVIS W CONTRAST  Result Date: 12/27/2018 CLINICAL DATA:  Abdominal abscess or infection suspected. EXAM: CT ABDOMEN AND PELVIS WITH CONTRAST TECHNIQUE: Multidetector CT imaging of the abdomen and pelvis was performed using the standard protocol following bolus administration of intravenous contrast. CONTRAST:  100mL OMNIPAQUE IOHEXOL 300 MG/ML  SOLN COMPARISON:  On scale ID and he FINDINGS: Lower chest: There is a partially visualized central venous catheter with tip terminating near the cavoatrial junction.Heart size is enlarged. There is a small left-sided pleural effusion. There is atelectasis at the lung bases bilaterally. There are ground-glass airspace opacities involving the right lower lobe and right middle lobe. Hepatobiliary: There is decreased hepatic attenuation suggestive of hepatic steatosis. Normal gallbladder.There is no biliary ductal dilation. Pancreas: Normal contours without ductal dilatation. No peripancreatic fluid collection. Spleen: No splenic laceration or hematoma. Adrenals/Urinary Tract: --Adrenal glands: No adrenal hemorrhage. --Right kidney/ureter: No hydronephrosis or perinephric hematoma. --Left kidney/ureter: No hydronephrosis or perinephric hematoma. --Urinary bladder: The urinary bladder is decompressed  with a Foley catheter. Stomach/Bowel: --Stomach/Duodenum: There are extensive postsurgical changes involving the stomach and distal esophagus likely representing a gastric bypass. There is a surgical tube in the left upper quadrant. This may represent the patient's reported gastrostomy tube. This tube is not appear to be located intraluminally. This tube appears to be  centered within an air and fluid collection concerning for an abscess. 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 There is no evidence for small bowel obstruction. The likely multiple adhesions in the left upper quadrant. --Colon: The patient is status post right hemicolectomy. Scattered colonic diverticular noted. --Appendix: Surgically absent. Vascular/Lymphatic: Atherosclerotic calcification is present within the non-aneurysmal abdominal aorta, without hemodynamically significant stenosis. There is an IVC filter in place. There is a possible filling defect within the right common femoral vein. --No retroperitoneal lymphadenopathy. --No mesenteric lymphadenopathy. --No pelvic or inguinal lymphadenopathy. Reproductive: Unremarkable Other: There is gas tracking medially from the left upper quadrant air and fluid collection. 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 More inferiorly at the level of the umbilicus there is a 3.6 x 2.2 cm fluid collection that appears to track to the skin surface on the left anterior abdomen. This may represent an additional enteric cutaneous fistula with associated small abscess. There is scattered hyperdense areas along the patient's anterior midline abdominal wall. This may represent surgical material or extraluminal oral contrast from prior contrast enhanced examination is. Musculoskeletal. No acute displaced fractures. IMPRESSION: 1. There are extensive gastric and small bowel surgical changes in the left upper quadrant as detailed above. There is a surgical tube in the left upper quadrant. This presumably represents the patient's reported gastrostomy tube. This tube appears to be malpositioned and is likely not intraluminal. This tube appears to be located with in an air and fluid collection concerning for an abscess. This air and fluid collection tracks medially and has likely fistulized to the cutaneous  surface. 2. Status post right hemicolectomy. 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 3. 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 4. Ground-glass airspace opacities in the right middle and right lower lobes concerning for a developing infectious process. 5. Small left-sided pleural effusion. Bibasilar atelectasis is noted. 6. Findings are suspicious for a filling defect within the right common femoral vein. This could represent a DVT. Correlation with ultrasound is recommended. There is a well-positioned IVC filter in place. 7.  Aortic Atherosclerosis (ICD10-I70.0). These results will be called to the ordering clinician or representative by the Radiologist Assistant, and communication documented in the PACS or zVision Dashboard. Electronically Signed   By: Constance Holster M.D.   On: 12/27/2018 19:32   DG CHEST PORT 1 VIEW  Result Date: 12/17/2018 CLINICAL DATA:  Respiratory distress EXAM: PORTABLE CHEST 1 VIEW COMPARISON:  None. FINDINGS: Tracheostomy tube in the mid trachea. Lung volumes are diminished with diffuse hazy opacities likely reflecting some atelectatic change. More focal opacity is noted in the left lung base adjacent a moderate left pleural effusion. No pneumothorax. Cardiomediastinal contours partially obscured by overlying opacity. Visible portions are unremarkable. No acute osseous or soft tissue abnormality. Surgical clips in the left upper quadrant. IMPRESSION: 1. Focal opacity in the left lung base adjacent to a moderate left pleural effusion, favor passive atelectasis though underlying infection cannot be excluded. 2. Low  lung volumes with more diffuse atelectatic changes. 3. Tracheostomy tube in the mid trachea. Electronically Signed   By: Kreg Shropshire M.D.   On: 01/22/2019 20:45   DG Abd Portable 1V  Result Date:  01/22/19 CLINICAL DATA:  Concern for ileus EXAM: PORTABLE ABDOMEN - 1 VIEW COMPARISON:  None. FINDINGS: A percutaneous enteric tube projects over the left upper quadrant with extensive surgical material seen in the left upper quadrant as well. There is a paucity of abdominal bowel gas which is a nonspecific finding. No convincing features of free intraperitoneal air though evaluation limited on supine only radiography. Dense calcification projects over the right sacrum. Could reflect material within the bowel, fecalith from the colon, amongst other etiologies. Few phleboliths are noted in the pelvis. Degenerative changes present in the spine and bony pelvis. IMPRESSION: 1. Paucity of abdominal bowel gas which is a nonspecific finding that can be both normal or seen with ileus or obstruction. 2. Right upper quadrant percutaneous enteric tube with postsurgical changes in the left upper quadrant. Placement poorly assessed in the absence of contrast. 3. Dense calcification projects over the right sacrum, nonspecific. Possibly ingested material, fecalith, fibroid among other possibilities. Electronically Signed   By: Kreg Shropshire M.D.   On: 01-22-2019 20:48    Assessment/Plan Active Problems:   Acute on chronic respiratory failure with hypoxia (HCC)   Severe sepsis (HCC)   Supraventricular tachycardia (HCC)   Pulmonary embolism (HCC)   Perforated abdominal viscus   1. Acute on chronic respiratory failure with hypoxia patient is going to continue on T collar wean titrate oxygen continue pulmonary toilet supportive care 2. Severe sepsis resolved hemodynamics are stable 3. Supraventricular tachycardia controlled we will continue to follow. 4. Pulmonary embolism has an IVC filter in place 5. Perforated viscus status post surgical repair we will continue with supportive care patient also is on TPN   I have personally seen and evaluated the patient, evaluated laboratory and imaging results, formulated the  assessment and plan and placed orders. The Patient requires high complexity decision making for assessment and support.  Case was discussed on Rounds with the Respiratory Therapy Staff  Yevonne Pax, MD [facility] Pulmonary Critical Care Medicine Sleep Medicine

## 2018-12-29 LAB — URINE CULTURE: Culture: >=100000 — AB

## 2018-12-29 LAB — CULTURE, RESPIRATORY W GRAM STAIN: Culture: NORMAL

## 2018-12-29 MED FILL — Medication: Qty: 1 | Status: AC

## 2019-01-01 LAB — CULTURE, BLOOD (ROUTINE X 2)
Culture: NO GROWTH
Culture: NO GROWTH
Special Requests: ADEQUATE

## 2019-01-17 NOTE — Progress Notes (Signed)
Patient ID: Holly Wallace, female   DOB: Apr 24, 1950, 69 y.o.   MRN: [id number]   Pt was Tx to [facility] 2-3 days ago from [place] Hzx many abdominal surgeries- hemicolectomy; G tube; wash outs....   Admitted to [facility] for vent management Hx Afib-- Hep injection for now COPD; HTN; Sz; Recent PE  Note from outside hosp: Abdominal x-ray showed large hiatal hernia, nonobstructive bowel gas pattern.  CT of the abdomen and pelvis showed large hiatal hernia and extraluminal air with infiltrative changes within the hernia indicating perforated viscus.  Surgery was consulted.  Patient was taken emergently to the OR for the first of many abdominal procedures.  She has a history of gastric bypass.   Gastric tube placed 11/22/18 per chart  Admission evaluation revealed foul smelling ouput from G tube G tube has not been used for feedings per [facility] But came to [facility] wit G tube hooked to a drain device.  CT abd 12/27/18:  IMPRESSION: 1. There are extensive gastric and small bowel surgical changes in the left upper quadrant as detailed above. There is a surgical tube in the left upper quadrant. This presumably represents the patient's reported gastrostomy tube. This tube appears to be malpositioned and is likely not intraluminal. This tube appears to be located with in an air and fluid collection concerning for an abscess. This air and fluid collection tracks medially and has likely fistulized to the cutaneous surface. 2. Status post right hemicolectomy. 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 3. 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 4. Ground-glass airspace opacities in the right middle and right lower lobes concerning for a developing infectious process. 5. Small left-sided pleural effusion. Bibasilar atelectasis  is noted. 6. Findings are suspicious for a filling defect within the right common femoral vein. This could represent a DVT. Correlation with ultrasound is recommended. There is a well-positioned IVC filter in place.  [facility] has asked IR to evaluate this G tube and replace vs intervention regarding new abscess seen on CT  Discussed with Dr Earleen Newport His recommendation is to inject tube-- evaluate if needs replacement for malposition If salvageable? May need intervention/abscess drain placement if necessary  [facility] getting consent from family for same Pt is NPO Hep inj held

## 2019-01-17 DEATH — deceased

## 2020-09-20 IMAGING — CT CT ABD-PELV W/ CM
2 series · 14 of 42 positions shown, 16 images · IV contrast (APPLIED)
Comparison: On scale ID and he

CLINICAL DATA: Abdominal abscess or infection suspected.

EXAM:
CT ABDOMEN AND PELVIS WITH CONTRAST
TECHNIQUE: Multidetector CT imaging of the abdomen and pelvis was performed
using the standard protocol following bolus administration of
intravenous contrast.
CONTRAST:  100mL OMNIPAQUE IOHEXOL 300 MG/ML  SOLN

[Series 5: lung · axial · 0.90mm/px · z∈[+57,+253]mm · 11 of 114 slices shown, 13 images]
[im 8/114  soft-tissue]
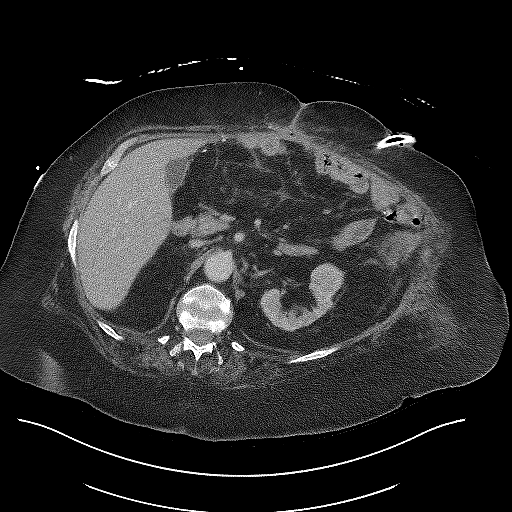
[im 8/114  bone]
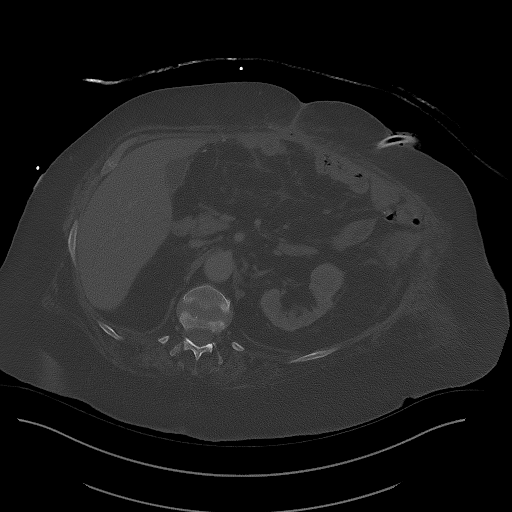
[im 19/114  soft-tissue]
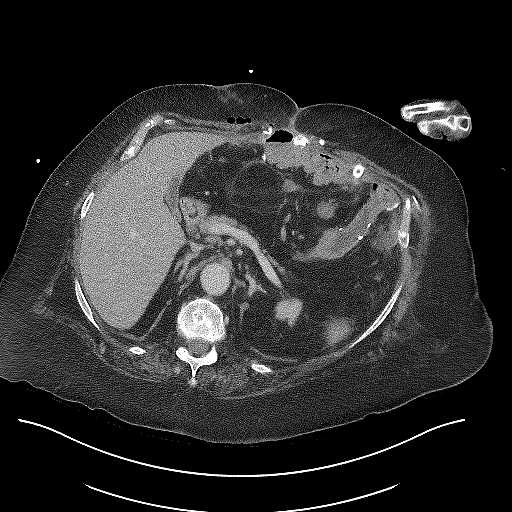
[im 26/114  soft-tissue]
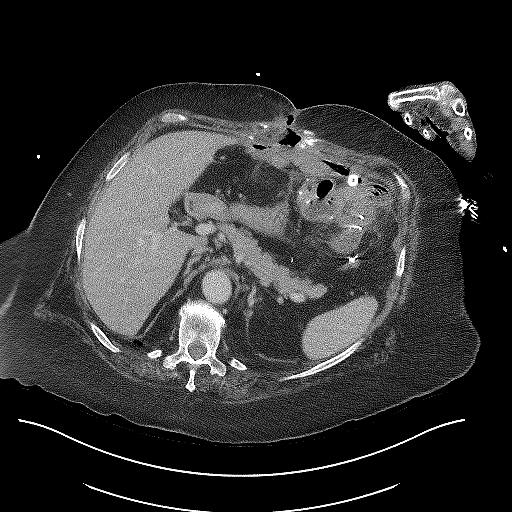
[im 37/114  soft-tissue]
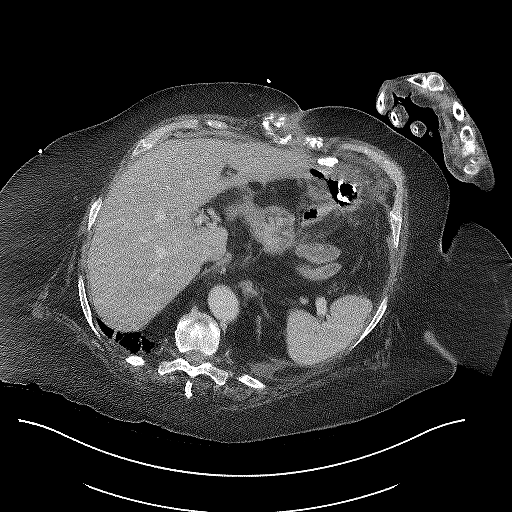
[im 48/114  soft-tissue]
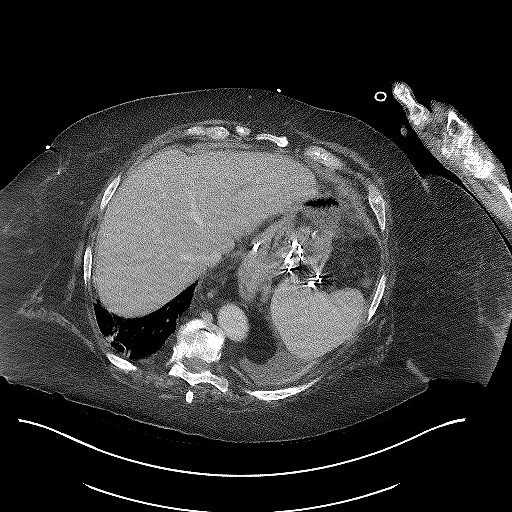
[im 59/114  soft-tissue]
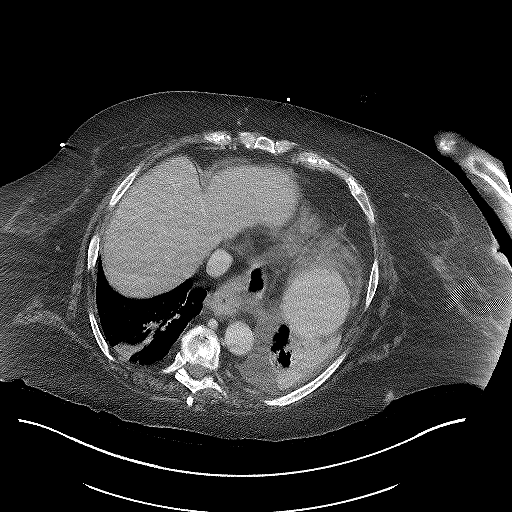
[im 66/114  soft-tissue]
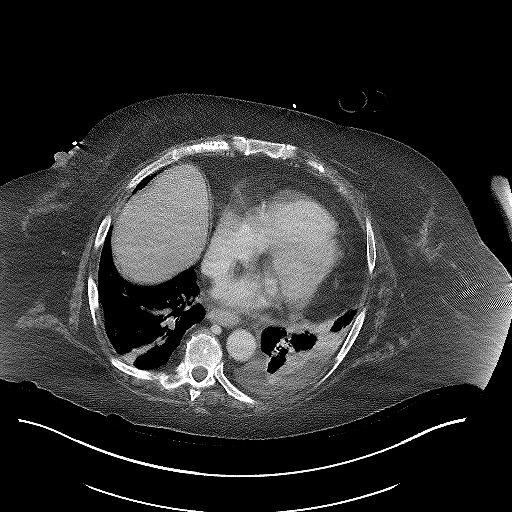
[im 77/114  soft-tissue]
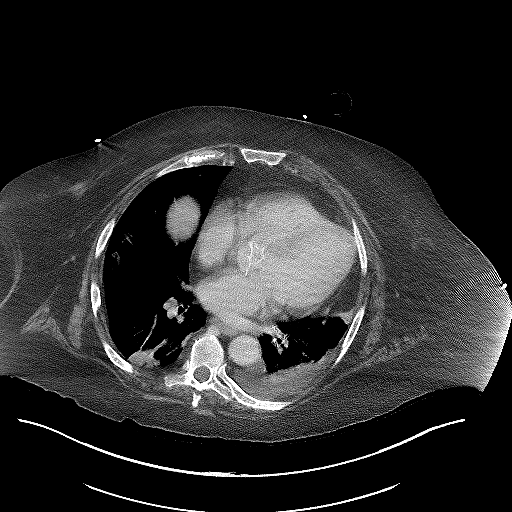
[im 88/114  soft-tissue]
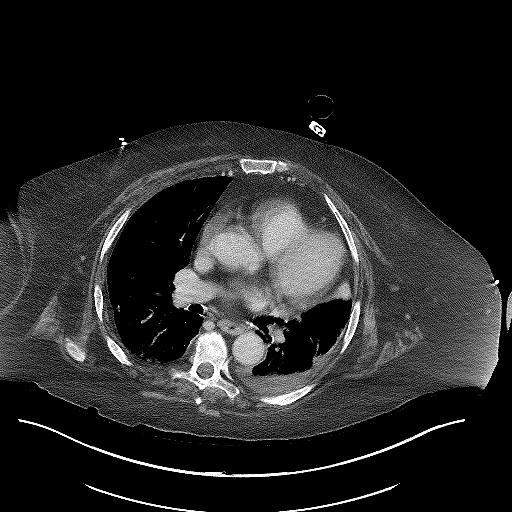
[im 88/114  bone]
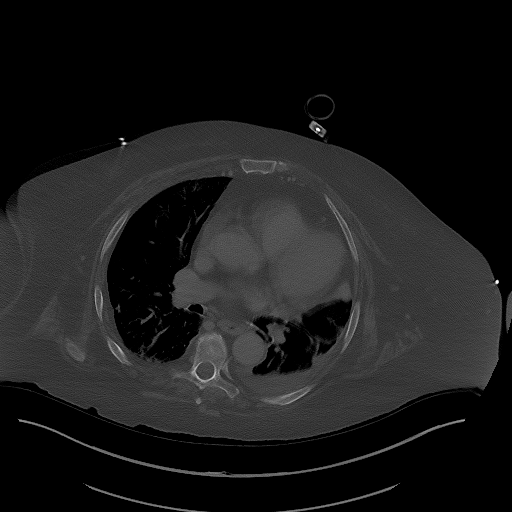
[im 95/114  soft-tissue]
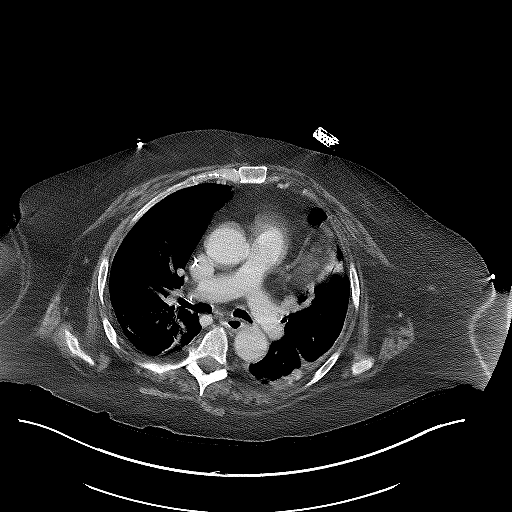
[im 106/114  soft-tissue]
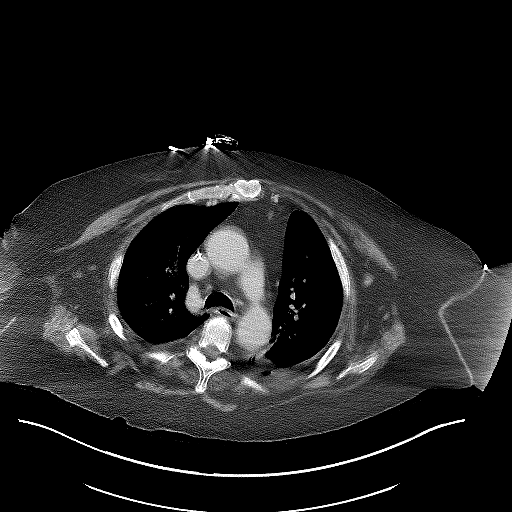

[Series 6: abdomen 3.0 mpr cor · coronal · 1.12mm/px · 3 of 119 slices shown]
[im 40/119  soft-tissue]
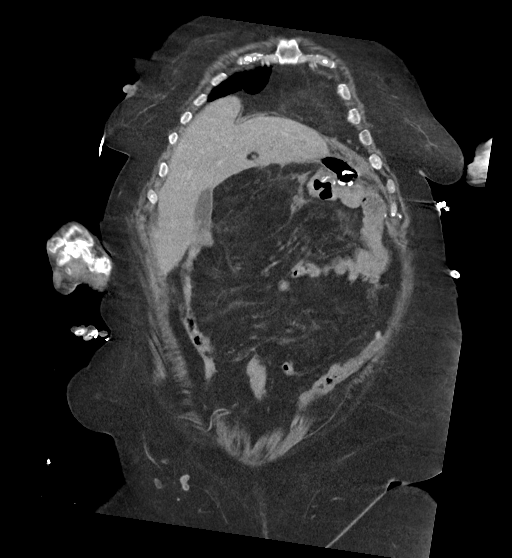
[im 53/119  soft-tissue]
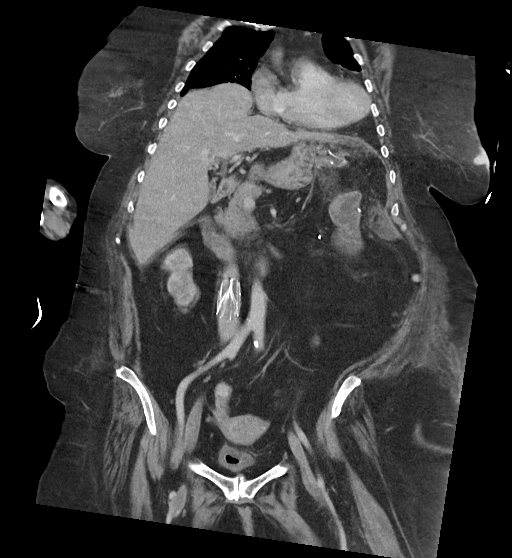
[im 66/119  soft-tissue]
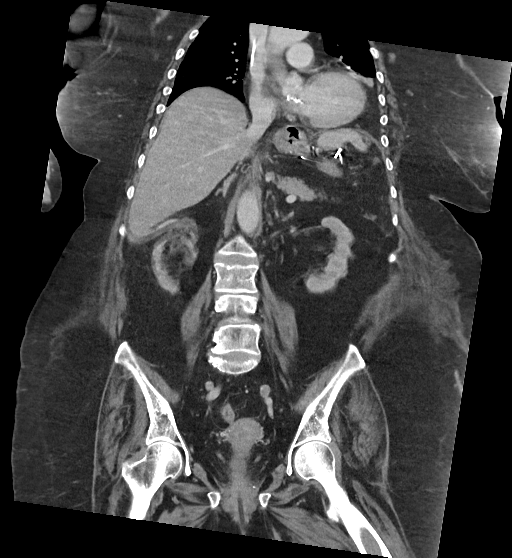

[14 of 42 positions shown; findings below may reference images not displayed]

FINDINGS: Lower chest: There is a partially visualized central venous catheter
with tip terminating near the cavoatrial junction.Heart size is
enlarged. There is a small left-sided pleural effusion. There is
atelectasis at the lung bases bilaterally. There are ground-glass
airspace opacities involving the right lower lobe and right middle
lobe.

Hepatobiliary: There is decreased hepatic attenuation suggestive of
hepatic steatosis. Normal gallbladder.There is no biliary ductal
dilation.

Pancreas: Normal contours without ductal dilatation. No
peripancreatic fluid collection.

Spleen: No splenic laceration or hematoma.

Adrenals/Urinary Tract:

--Adrenal glands: No adrenal hemorrhage.

--Right kidney/ureter: No hydronephrosis or perinephric hematoma.

--Left kidney/ureter: No hydronephrosis or perinephric hematoma.

--Urinary bladder: The urinary bladder is decompressed with a Foley
catheter.

Stomach/Bowel:

--Stomach/Duodenum: There are extensive postsurgical changes
involving the stomach and distal esophagus likely representing a
gastric bypass. There is a surgical tube in the left upper quadrant.
This may represent the patient's reported gastrostomy tube. This
tube is not appear to be located intraluminally. This tube appears
to be centered within an air and fluid collection concerning for an
abscess.

--Small bowel: There is an ileostomy in the left lower quadrant.
There is no evidence for small bowel obstruction. The likely
multiple adhesions in the left upper quadrant.

--Colon: The patient is status post right hemicolectomy. Scattered
colonic diverticular noted.

--Appendix: Surgically absent.

Vascular/Lymphatic: Atherosclerotic calcification is present within
the non-aneurysmal abdominal aorta, without hemodynamically
significant stenosis. There is an IVC filter in place. There is a
possible filling defect within the right common femoral vein.

--No retroperitoneal lymphadenopathy.

--No mesenteric lymphadenopathy.

--No pelvic or inguinal lymphadenopathy.

Reproductive: Unremarkable

Other: There is gas tracking medially from the left upper quadrant
air and fluid collection. This gas eventually tracks to the skin
surface raising concern for an enteric cutaneous fistula. More
inferiorly at the level of the umbilicus there is a 3.6 x 2.2 cm
fluid collection that appears to track to the skin surface on the
left anterior abdomen. This may represent an additional enteric
cutaneous fistula with associated small abscess. There is scattered
hyperdense areas along the patient's anterior midline abdominal
wall. This may represent surgical material or extraluminal oral
contrast from prior contrast enhanced examination is.

Musculoskeletal. No acute displaced fractures.
IMPRESSION: 1. There are extensive gastric and small bowel surgical changes in
the left upper quadrant as detailed above. There is a surgical tube
in the left upper quadrant. This presumably represents the patient's
reported gastrostomy tube. This tube appears to be malpositioned and
is likely not intraluminal. This tube appears to be located with in
an air and fluid collection concerning for an abscess. This air and
fluid collection tracks medially and has likely fistulized to the
cutaneous surface.
2. Status post right hemicolectomy. There is a left lower quadrant
ileostomy in place.
3. There is a midline fluid collection superior to the umbilicus
that appears to track to the cutaneous surface, concerning for an
enteric cutaneous fistula with a small associated abscess.
4. Ground-glass airspace opacities in the right middle and right
lower lobes concerning for a developing infectious process.
5. Small left-sided pleural effusion. Bibasilar atelectasis is
noted.
6. Findings are suspicious for a filling defect within the right
common femoral vein. This could represent a DVT. Correlation with
ultrasound is recommended. There is a well-positioned IVC filter in
place.
7.  Aortic Atherosclerosis ([id number].L).

These results will be called to the ordering clinician or
representative by the Radiologist Assistant, and communication
documented in the PACS or zVision Dashboard.
